# Patient Record
Sex: Male | Born: 1960 | Race: White | Hispanic: No | Marital: Married | Smoking: Never smoker
Health system: Southern US, Community
[De-identification: ages and names within clinical notes are randomized; demographics above are authoritative.]

---

## 2000-06-28 ENCOUNTER — Emergency Department (HOSPITAL_COMMUNITY): Admission: EM | Admit: 2000-06-28 | Discharge: 2000-06-28 | Payer: Self-pay | Admitting: Emergency Medicine

## 2000-08-07 ENCOUNTER — Encounter: Payer: Self-pay | Admitting: Emergency Medicine

## 2000-08-07 ENCOUNTER — Emergency Department (HOSPITAL_COMMUNITY): Admission: EM | Admit: 2000-08-07 | Discharge: 2000-08-07 | Payer: Self-pay | Admitting: Emergency Medicine

## 2000-08-10 ENCOUNTER — Ambulatory Visit (HOSPITAL_BASED_OUTPATIENT_CLINIC_OR_DEPARTMENT_OTHER): Admission: RE | Admit: 2000-08-10 | Discharge: 2000-08-10 | Payer: Self-pay | Admitting: Orthopaedic Surgery

## 2011-02-18 ENCOUNTER — Ambulatory Visit: Payer: 59 | Attending: Family Medicine | Admitting: Physical Therapy

## 2011-02-18 DIAGNOSIS — M545 Low back pain, unspecified: Secondary | ICD-10-CM | POA: Insufficient documentation

## 2011-02-18 DIAGNOSIS — IMO0001 Reserved for inherently not codable concepts without codable children: Secondary | ICD-10-CM | POA: Insufficient documentation

## 2011-02-18 DIAGNOSIS — M2569 Stiffness of other specified joint, not elsewhere classified: Secondary | ICD-10-CM | POA: Insufficient documentation

## 2011-02-27 ENCOUNTER — Encounter: Payer: 59 | Admitting: Physical Therapy

## 2012-02-18 ENCOUNTER — Emergency Department (HOSPITAL_COMMUNITY): Payer: 59

## 2012-02-18 ENCOUNTER — Encounter (HOSPITAL_COMMUNITY): Payer: Self-pay | Admitting: Emergency Medicine

## 2012-02-18 ENCOUNTER — Emergency Department (HOSPITAL_COMMUNITY)
Admission: EM | Admit: 2012-02-18 | Discharge: 2012-02-18 | Disposition: A | Discharge status: Home / Self Care | Payer: 59 | Attending: Emergency Medicine | Admitting: Emergency Medicine

## 2012-02-18 DIAGNOSIS — Y9329 Activity, other involving ice and snow: Secondary | ICD-10-CM | POA: Insufficient documentation

## 2012-02-18 DIAGNOSIS — S8990XA Unspecified injury of unspecified lower leg, initial encounter: Secondary | ICD-10-CM | POA: Insufficient documentation

## 2012-02-18 DIAGNOSIS — X500XXA Overexertion from strenuous movement or load, initial encounter: Secondary | ICD-10-CM | POA: Insufficient documentation

## 2012-02-18 DIAGNOSIS — M25562 Pain in left knee: Secondary | ICD-10-CM

## 2012-02-18 DIAGNOSIS — Y929 Unspecified place or not applicable: Secondary | ICD-10-CM | POA: Insufficient documentation

## 2012-02-18 DIAGNOSIS — Y9302 Activity, running: Secondary | ICD-10-CM | POA: Insufficient documentation

## 2012-02-18 DIAGNOSIS — S99929A Unspecified injury of unspecified foot, initial encounter: Secondary | ICD-10-CM | POA: Insufficient documentation

## 2012-02-18 MED ORDER — NAPROXEN 500 MG PO TABS: 500.0000 mg | ORAL_TABLET | Freq: Two times a day (BID) | ORAL | Status: AC

## 2012-02-18 NOTE — ED Provider Notes (Signed)
History     CSN: [id number]  Arrival date & time 02/18/12  1507   First MD Initiated Contact with Patient 02/18/12 1520      Chief Complaint  Patient presents with  . Leg Pain    (Consider location/radiation/quality/duration/timing/severity/associated sxs/prior treatment) HPI Comments: Patient reports that just prior to arrival he was jogging up a hill in the snow when he felt a pop in his left posterior knee.  He did not fall.  He has had mild swelling of the knee since that time.  He has been able to ambulate with a limp, but has increased pain with ambulation.  He is able to full flex the knee, but states that he is not able to fully extend the knee.  No previous injury of the knee.  He has not taken anything for pain prior to arrival.    Patient is a 52 y.o. male presenting with knee pain. The history is provided by the patient.  Knee Pain Pain details:    Onset quality:  Sudden   Timing:  Constant   Progression:  Unchanged Relieved by:  Nothing Worsened by:  Bearing weight and extension Ineffective treatments:  None tried Associated symptoms: decreased ROM   Associated symptoms: no fever and no numbness     History reviewed. No pertinent past medical history.  History reviewed. No pertinent past surgical history.  History reviewed. No pertinent family history.  History  Substance Use Topics  . Smoking status: Never Smoker   . Smokeless tobacco: Not on file  . Alcohol Use: Yes      Review of Systems  Constitutional: Negative for fever.  Musculoskeletal: Positive for joint swelling.       Left knee pain  Skin: Negative for color change.  Neurological: Negative for numbness.  All other systems reviewed and are negative.    Allergies  Review of patient's allergies indicates no known allergies.  Home Medications   Current Outpatient Rx  Name  Route  Sig  Dispense  Refill  . atorvastatin (LIPITOR) 80 MG tablet   Oral   Take 40 mg by mouth daily.          . fenofibrate micronized (LOFIBRA) 200 MG capsule   Oral   Take 200 mg by mouth daily before breakfast.         . sertraline (ZOLOFT) 50 MG tablet   Oral   Take 100 mg by mouth daily.           BP 122/99  Pulse 99  Temp(Src) 98.1 F (36.7 C) (Oral)  Resp 16  SpO2 97%  Physical Exam  Nursing note and vitals reviewed. Constitutional: He appears well-developed and well-nourished. No distress.  HENT:  Head: Normocephalic and atraumatic.  Neck: Normal range of motion. Neck supple.  Cardiovascular: Normal rate, regular rhythm and normal heart sounds.   Pulses:      Dorsalis pedis pulses are 2+ on the right side, and 2+ on the left side.  Pulmonary/Chest: Effort normal and breath sounds normal.  Musculoskeletal:       Left knee: He exhibits no erythema, no LCL laxity, normal patellar mobility and no MCL laxity. Tenderness found. Medial joint line tenderness noted.  Mild swelling of the posterior knee Patient with full flexion of the left knee.  Patient not able to fully extend knee.  Knee in approximately five degrees of flexion.   No pain with varus or valgus stress Negative Anterior and Posterior Drawer  Neurological: He is  alert. No sensory deficit.  Skin: Skin is warm and dry. He is not diaphoretic. No erythema.  Psychiatric: He has a normal mood and affect.    ED Course  Procedures (including critical care time)  Labs Reviewed - No data to display Dg Knee Complete 4 Views Left  02/18/2012  *RADIOLOGY REPORT*  Clinical Data: Sudden onset knee pain while jogging.  LEFT KNEE - COMPLETE 4+ VIEW  Comparison:  None.  Findings:  There is no evidence of fracture, dislocation, or joint effusion.  There is no evidence of arthropathy or other focal bone abnormality.  Soft tissues are unremarkable.  IMPRESSION: Negative.   Original Report Authenticated By: Myles Rosenthal, M.D.      No diagnosis found.    MDM  Patient presenting with left knee pain that presented while  running up a hill.  No obvious deformity.  Xray negative.  Neurovascularly intact.  Patient with pain with ambulation.  Patient given knee immobilizer and crutches.  Patient also given referral to Orthopedics.        Pascal Lux Covel, PA-C 02/19/12 [phone]

## 2012-02-18 NOTE — ED Notes (Signed)
Pt states that he was jogging up an incline in the snow and felt the back of his left knee "pop".  States that his knee "locked up".  No obvious deformity.  Pt walking with a severe limp.

## 2012-02-20 NOTE — ED Provider Notes (Signed)
Medical screening examination/treatment/procedure(s) were performed by non-physician practitioner and as supervising physician I was immediately available for consultation/collaboration.    Kenidee Cregan D Levis Nazir, MD 02/20/12 1041 

## 2012-03-03 ENCOUNTER — Ambulatory Visit
Admission: RE | Admit: 2012-03-03 | Discharge: 2012-03-03 | Disposition: A | Discharge status: Home / Self Care | Payer: 59 | Source: Ambulatory Visit | Attending: Orthopedic Surgery | Admitting: Orthopedic Surgery

## 2012-03-03 ENCOUNTER — Other Ambulatory Visit: Payer: Self-pay | Admitting: Orthopedic Surgery

## 2012-03-03 DIAGNOSIS — M79605 Pain in left leg: Secondary | ICD-10-CM

## 2015-02-20 IMAGING — US US EXTREM LOW VENOUS*L*
1 series · 14 of 24 positions shown · non-contrast
Comparison: None

CLINICAL DATA: LEFT LEG PAIN AND SWELLING/// DVT;;

LEFT LOWER EXTREMITY VENOUS DUPLEX ULTRASOUND
TECHNIQUE: Gray-scale sonography with compression, as well as color
and duplex ultrasound, were performed to evaluate the deep venous
system from the level of the common femoral vein through the
popliteal and proximal calf veins.

[Series 1: us extrem low venous*left* · 14 of 41 slices shown]
[im 1/41]
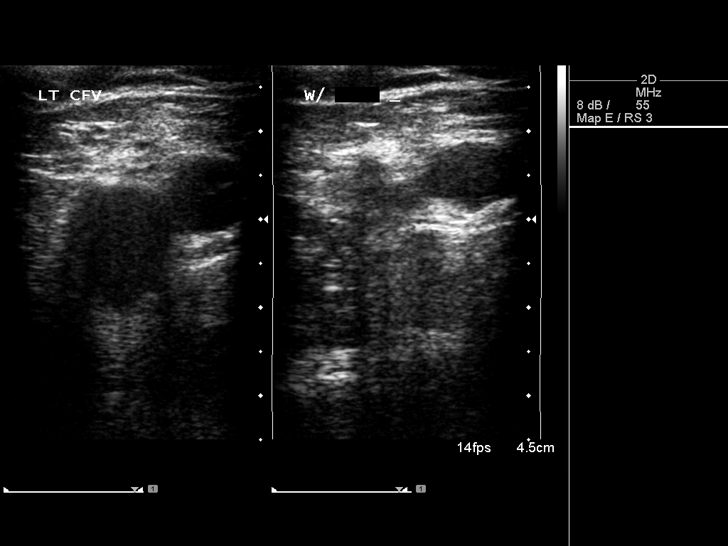
[im 4/41]
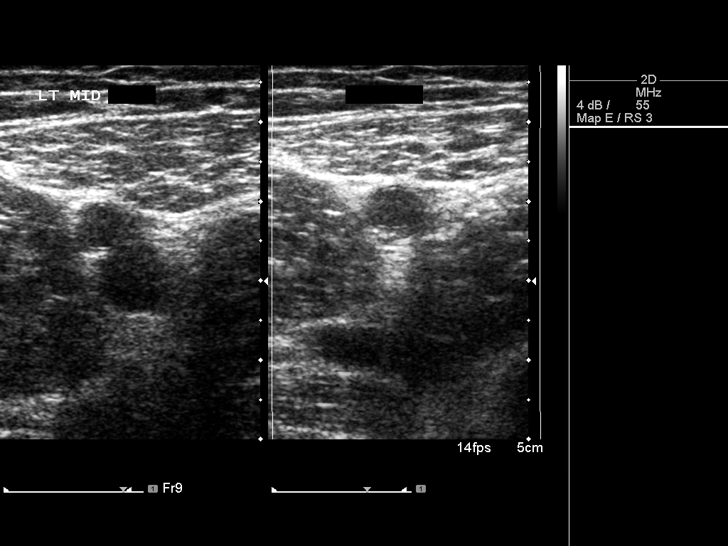
[im 7/41]
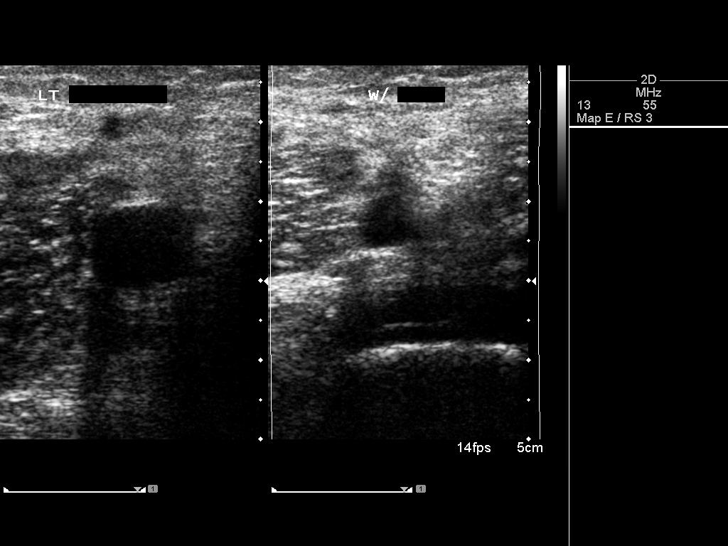
[im 11/41]
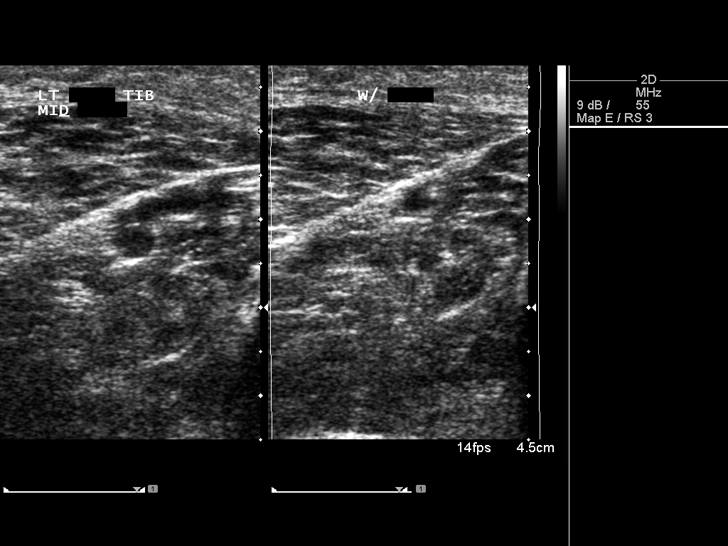
[im 13/41]
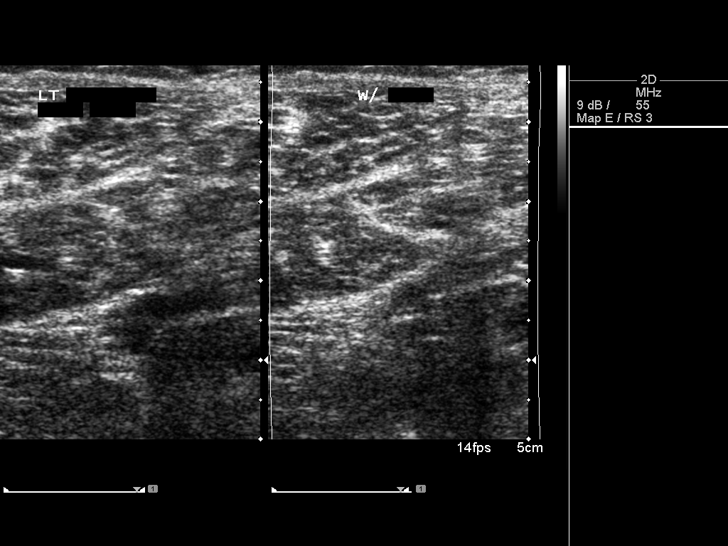
[im 16/41]
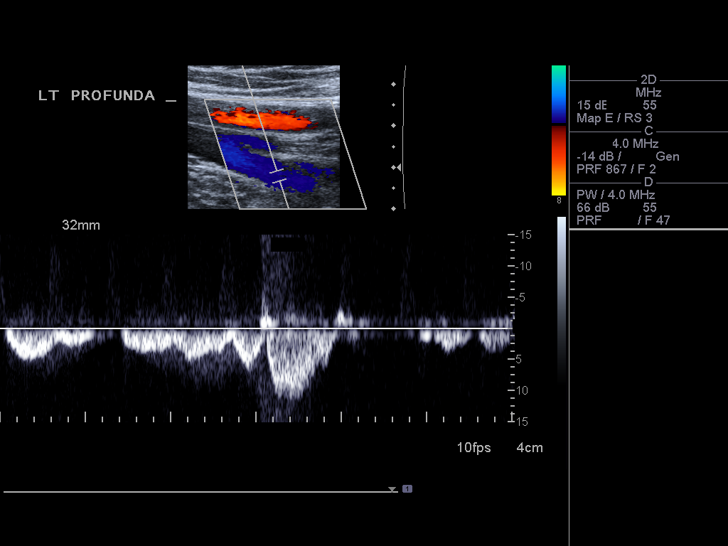
[im 20/41]
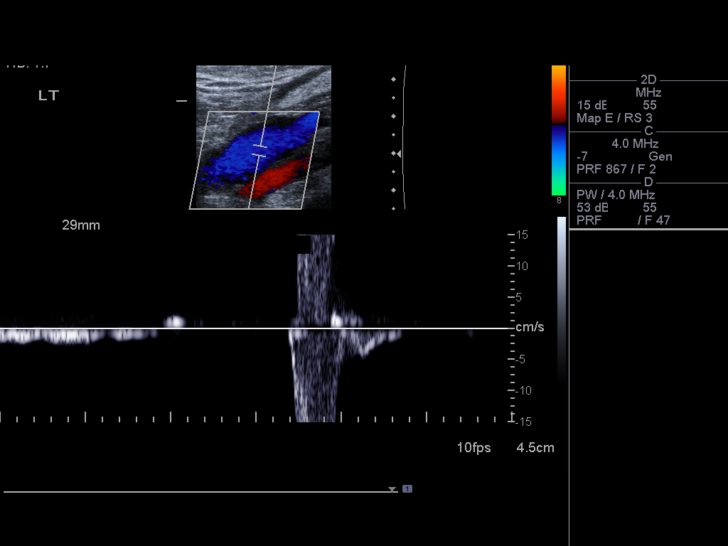
[im 21/41]
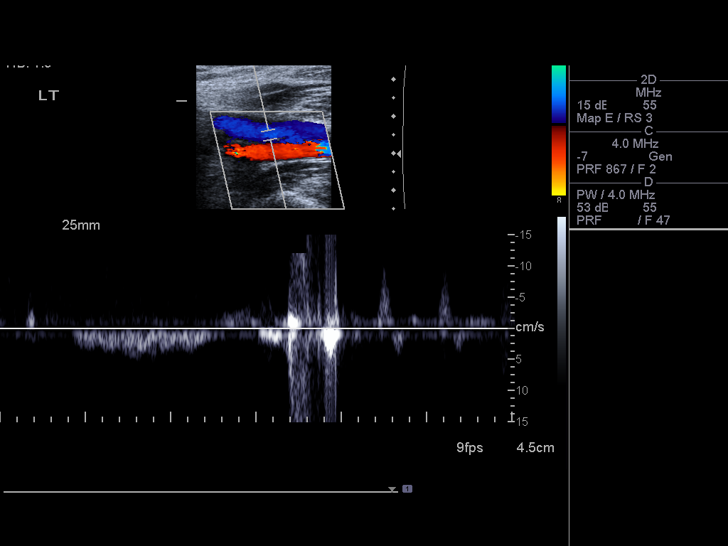
[im 25/41]
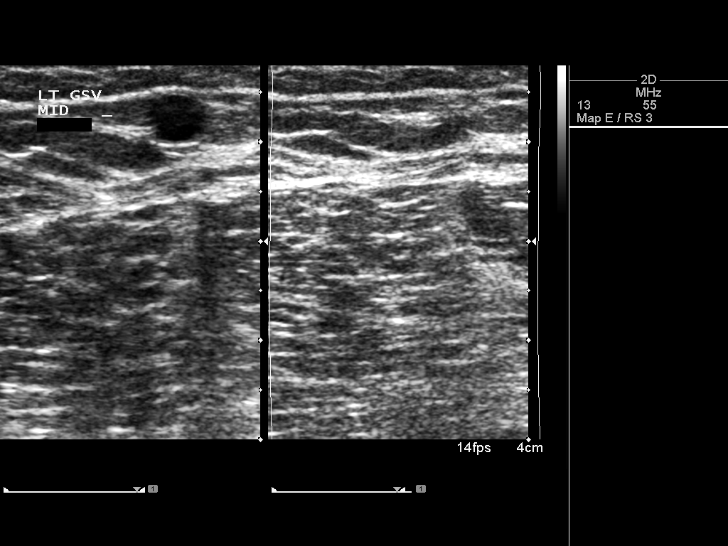
[im 28/41]
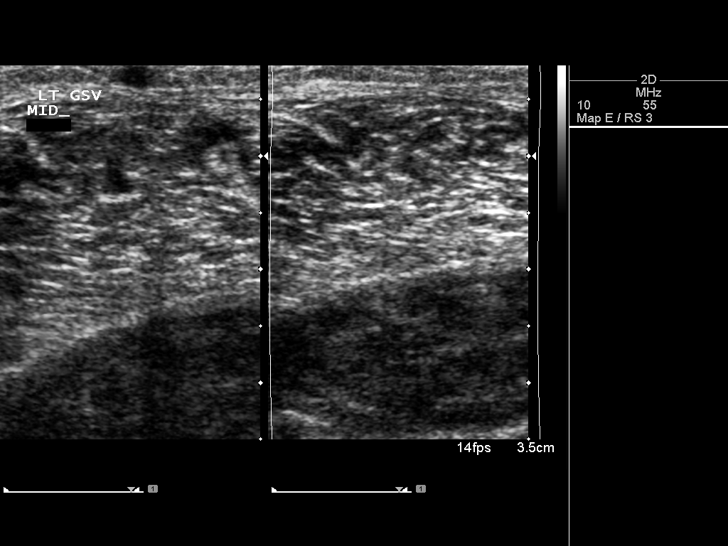
[im 32/41]
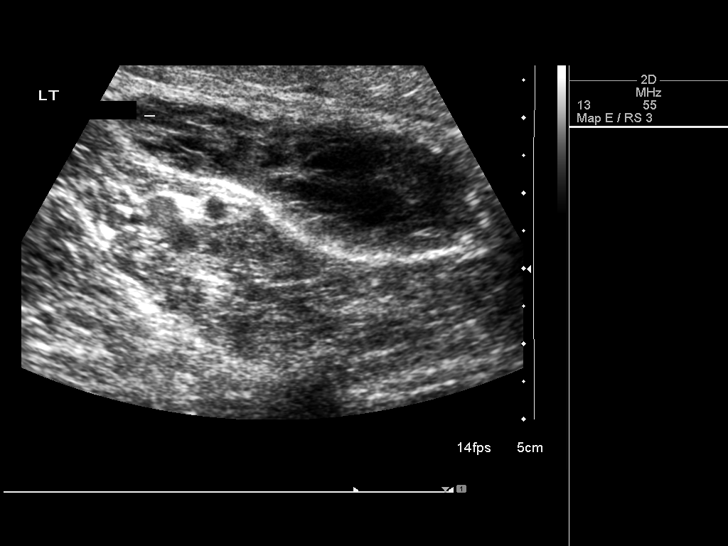
[im 34/41]
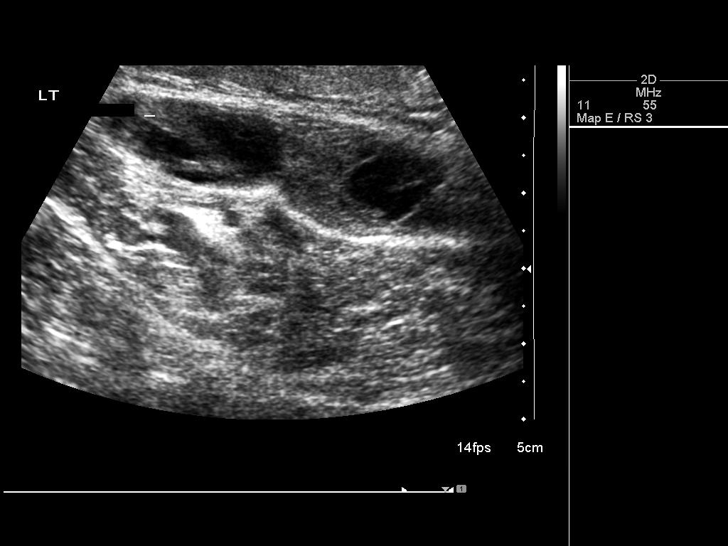
[im 37/41]
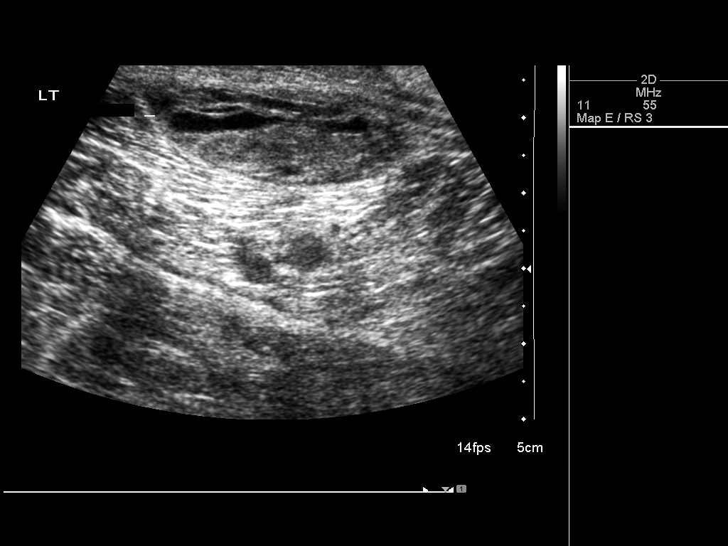
[im 41/41]
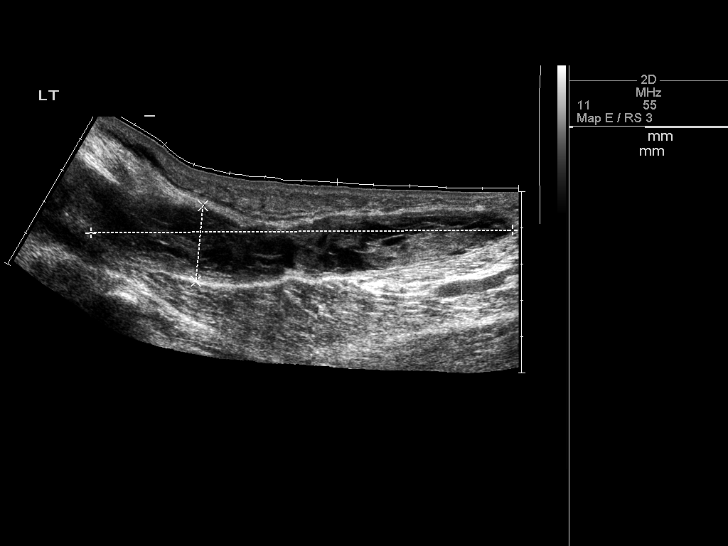

[14 of 24 positions shown; findings below may reference images not displayed]

FINDINGS: Normal compressibility and normal Doppler signal within
the common femoral, superficial femoral and popliteal veins, down
to the proximal calf veins.  No grayscale filling defects to
suggest DVT.

Complex fluid collection is noted within the proximal calf
measuring 11.6 x 2.3 x 4.8 cm.  This presumably represents
hematoma.
IMPRESSION: No evidence of left lower extremity deep vein thrombosis.

Complex fluid collection in the proximal calf, presumably hematoma.

## 2015-05-14 DIAGNOSIS — N401 Enlarged prostate with lower urinary tract symptoms: Secondary | ICD-10-CM | POA: Diagnosis not present

## 2015-07-05 ENCOUNTER — Encounter (HOSPITAL_COMMUNITY): Payer: Self-pay | Admitting: Emergency Medicine

## 2015-07-05 ENCOUNTER — Emergency Department (HOSPITAL_COMMUNITY)
Admission: EM | Admit: 2015-07-05 | Discharge: 2015-07-06 | Disposition: A | Discharge status: Home / Self Care | Payer: BLUE CROSS/BLUE SHIELD | Attending: Emergency Medicine | Admitting: Emergency Medicine

## 2015-07-05 DIAGNOSIS — Y929 Unspecified place or not applicable: Secondary | ICD-10-CM | POA: Insufficient documentation

## 2015-07-05 DIAGNOSIS — Y999 Unspecified external cause status: Secondary | ICD-10-CM | POA: Diagnosis not present

## 2015-07-05 DIAGNOSIS — S61210A Laceration without foreign body of right index finger without damage to nail, initial encounter: Secondary | ICD-10-CM | POA: Diagnosis not present

## 2015-07-05 DIAGNOSIS — W268XXA Contact with other sharp object(s), not elsewhere classified, initial encounter: Secondary | ICD-10-CM | POA: Diagnosis not present

## 2015-07-05 DIAGNOSIS — Y9389 Activity, other specified: Secondary | ICD-10-CM | POA: Insufficient documentation

## 2015-07-05 DIAGNOSIS — S61219A Laceration without foreign body of unspecified finger without damage to nail, initial encounter: Secondary | ICD-10-CM

## 2015-07-05 NOTE — ED Notes (Signed)
Dr. James at bedside  

## 2015-07-05 NOTE — ED Provider Notes (Signed)
CSN: [id number]     Arrival date & time 07/05/15  2250 History   By signing my name below, I, Vista Minkobert Ross, attest that this documentation has been prepared under the direction and in the presence of Rolland PorterMark Tijuana Scheidegger, MD. Electronically signed, Vista Minkobert Ross, ED Scribe. 07/06/2015. 12:35 AM.   Chief Complaint  Patient presents with  . Laceration   The history is provided by the patient. No language interpreter was used.   HPI Comments: Cherly BeachRonnie Arreguin is a 55 y.o. male who presents to the Emergency Department s/p an injury that occurred to his left pointer finger approximately 4 hours ago. Pt states he was using a hand saw when his hand slipped and he dropped it on his left pointer finger. Pt states he was not wearing any gloves. Bleeding is controlled at this time. Pt rates his pain currently 3/10. Pt is able to move the finger without difficulty.    History reviewed. No pertinent past medical history. History reviewed. No pertinent past surgical history. No family history on file. Social History  Substance Use Topics  . Smoking status: Never Smoker   . Smokeless tobacco: None  . Alcohol Use: Yes    Review of Systems  Constitutional: Negative for fever, chills, diaphoresis, appetite change and fatigue.  HENT: Negative for mouth sores, sore throat and trouble swallowing.   Eyes: Negative for visual disturbance.  Respiratory: Negative for cough, chest tightness, shortness of breath and wheezing.   Cardiovascular: Negative for chest pain.  Gastrointestinal: Negative for nausea, vomiting, abdominal pain, diarrhea and abdominal distention.  Endocrine: Negative for polydipsia, polyphagia and polyuria.  Genitourinary: Negative for dysuria, frequency and hematuria.  Musculoskeletal: Negative for gait problem.  Skin: Positive for wound (right pointer finger). Negative for color change, pallor and rash.  Neurological: Negative for dizziness, syncope, light-headedness and headaches.  Hematological: Does  not bruise/bleed easily.  Psychiatric/Behavioral: Negative for behavioral problems and confusion.  All other systems reviewed and are negative.     Allergies  Review of patient's allergies indicates no known allergies.  Home Medications   Prior to Admission medications   Medication Sig Start Date End Date Taking? Authorizing Provider  atorvastatin (LIPITOR) 80 MG tablet Take 40 mg by mouth daily.    Historical Provider, MD  fenofibrate micronized (LOFIBRA) 200 MG capsule Take 200 mg by mouth daily before breakfast.    Historical Provider, MD  naproxen (NAPROSYN) 500 MG tablet Take 1 tablet (500 mg total) by mouth 2 (two) times daily. 02/18/12   Heather Laisure, PA-C  sertraline (ZOLOFT) 50 MG tablet Take 100 mg by mouth daily.    Historical Provider, MD   BP 134/89 mmHg  Pulse 70  Temp(Src) 98.4 F (36.9 C) (Oral)  Resp 16  Ht 5\' 11"  (1.803 m)  Wt 188 lb (85.276 kg)  BMI 26.23 kg/m2  SpO2 96% Physical Exam  Constitutional: He is oriented to person, place, and time. He appears well-developed and well-nourished. No distress.  HENT:  Head: Normocephalic.  Eyes: Conjunctivae are normal. Pupils are equal, round, and reactive to light. No scleral icterus.  Neck: Normal range of motion. Neck supple. No thyromegaly present.  Cardiovascular: Normal rate and regular rhythm.  Exam reveals no gallop and no friction rub.   No murmur heard. Pulmonary/Chest: Effort normal and breath sounds normal. No respiratory distress. He has no wheezes. He has no rales.  Abdominal: Soft. Bowel sounds are normal. He exhibits no distension. There is no tenderness. There is no rebound.  Musculoskeletal:  Normal range of motion.  2cm laceration on dorsum of right second mcp Tendon and sensation are both intact.  Neurological: He is alert and oriented to person, place, and time.  Skin: Skin is warm and dry. No rash noted.  Psychiatric: He has a normal mood and affect. His behavior is normal.  Nursing note  and vitals reviewed.   ED Course  Procedures  DIAGNOSTIC STUDIES: Oxygen Saturation is 96% on RA, normal by my interpretation.  COORDINATION OF CARE: 11:59 PM-Will order suture and medication for pain. Discussed treatment plan with pt at bedside and pt agreed to plan.   Labs Review Labs Reviewed - No data to display  Imaging Review No results found. I have personally reviewed and evaluated these images and lab results as part of my medical decision-making.   EKG Interpretation None      MDM   Final diagnoses:  Finger laceration, initial encounter    LACERATION REPAIR Performed by: Claudean KindsJAMES, Tyerra Loretto JOSEPH Authorized by: Claudean KindsJAMES, Tryniti Laatsch JOSEPH Consent: Verbal consent obtained. Risks and benefits: risks, benefits and alternatives were discussed Consent given by: patient Patient identity confirmed: provided demographic data Prepped and Draped in normal sterile fashion Wound explored  Laceration Location: RIF  Laceration Length: 2cm  No Foreign Bodies seen or palpated  Anesthesia: local infiltration  Local anesthetic: lidocaine 1% c epinephrine  Anesthetic total: 3 ml  Irrigation method: syringe Amount of cleaning: standard  Skin closure: 4-0 Nylon  Number of sutures: 5  Technique: simple + 1 Horizontal mattress  Patient tolerance: Patient tolerated the procedure well with no immediate complications.    Rolland PorterMark Michie Molnar, MD 07/06/15 [phone]

## 2015-07-05 NOTE — ED Notes (Signed)
Pt from home with a 1 inch deep laceration on his right pointer finger after a saw fell on his hand. Bleeding is controlled at this time. Pt is able to move his finger and rates his pain at 3/10. Pt's last tetanus shot was within the past year.

## 2015-07-06 NOTE — ED Notes (Signed)
Pt given discharge instructions, verbalized understanding of need to follow up for suture removal, and reasons to return to the ED. Pt reports pain controlled. Pt aware of suture care at home. Pt denied further questions or concerns at this time. Pt able to ambulate to exit without difficulty.

## 2015-07-06 NOTE — ED Notes (Signed)
MD has suture supplies at bedside.

## 2015-07-06 NOTE — Discharge Instructions (Signed)
Suture removal in 10 days  Laceration Care, Adult A laceration is a cut that goes through all layers of the skin. The cut also goes into the tissue that is right under the skin. Some cuts heal on their own. Others need to be closed with stitches (sutures), staples, skin adhesive strips, or wound glue. Taking care of your cut lowers your risk of infection and helps your cut to heal better. HOW TO TAKE CARE OF YOUR CUT For stitches or staples:  Keep the wound clean and dry.  If you were given a bandage (dressing), you should change it at least one time per day or as told by your doctor. You should also change it if it gets wet or dirty.  Keep the wound completely dry for the first 24 hours or as told by your doctor. After that time, you may take a shower or a bath. However, make sure that the wound is not soaked in water until after the stitches or staples have been removed.  Clean the wound one time each day or as told by your doctor:  Wash the wound with soap and water.  Rinse the wound with water until all of the soap comes off.  Pat the wound dry with a clean towel. Do not rub the wound.  After you clean the wound, put a thin layer of antibiotic ointment on it as told by your doctor. This ointment:  Helps to prevent infection.  Keeps the bandage from sticking to the wound.  Have your stitches or staples removed as told by your doctor. If your doctor used skin adhesive strips:   Keep the wound clean and dry.  If you were given a bandage, you should change it at least one time per day or as told by your doctor. You should also change it if it gets dirty or wet.  Do not get the skin adhesive strips wet. You can take a shower or a bath, but be careful to keep the wound dry.  If the wound gets wet, pat it dry with a clean towel. Do not rub the wound.  Skin adhesive strips fall off on their own. You can trim the strips as the wound heals. Do not remove any strips that are still  stuck to the wound. They will fall off after a while. If your doctor used wound glue:  Try to keep your wound dry, but you may briefly wet it in the shower or bath. Do not soak the wound in water, such as by swimming.  After you take a shower or a bath, gently pat the wound dry with a clean towel. Do not rub the wound.  Do not do any activities that will make you really sweaty until the skin glue has fallen off on its own.  Do not apply liquid, cream, or ointment medicine to your wound while the skin glue is still on.  If you were given a bandage, you should change it at least one time per day or as told by your doctor. You should also change it if it gets dirty or wet.  If a bandage is placed over the wound, do not let the tape for the bandage touch the skin glue.  Do not pick at the glue. The skin glue usually stays on for 5-10 days. Then, it falls off of the skin. General Instructions  To help prevent scarring, make sure to cover your wound with sunscreen whenever you are outside after stitches are  removed, after adhesive strips are removed, or when wound glue stays in place and the wound is healed. Make sure to wear a sunscreen of at least 30 SPF.  Take over-the-counter and prescription medicines only as told by your doctor.  If you were given antibiotic medicine or ointment, take or apply it as told by your doctor. Do not stop using the antibiotic even if your wound is getting better.  Do not scratch or pick at the wound.  Keep all follow-up visits as told by your doctor. This is important.  Check your wound every day for signs of infection. Watch for:  Redness, swelling, or pain.  Fluid, blood, or pus.  Raise (elevate) the injured area above the level of your heart while you are sitting or lying down, if possible. GET HELP IF:  You got a tetanus shot and you have any of these problems at the injection site:  Swelling.  Very bad pain.  Redness.  Bleeding.  You  have a fever.  A wound that was closed breaks open.  You notice a bad smell coming from your wound or your bandage.  You notice something coming out of the wound, such as wood or glass.  Medicine does not help your pain.  You have more redness, swelling, or pain at the site of your wound.  You have fluid, blood, or pus coming from your wound.  You notice a change in the color of your skin near your wound.  You need to change the bandage often because fluid, blood, or pus is coming from the wound.  You start to have a new rash.  You start to have numbness around the wound. GET HELP RIGHT AWAY IF:  You have very bad swelling around the wound.  Your pain suddenly gets worse and is very bad.  You notice painful lumps near the wound or on skin that is anywhere on your body.  You have a red streak going away from your wound.  The wound is on your hand or foot and you cannot move a finger or toe like you usually can.  The wound is on your hand or foot and you notice that your fingers or toes look pale or bluish.   This information is not intended to replace advice given to you by your health care provider. Make sure you discuss any questions you have with your health care provider.   Document Released: 06/10/2007 Document Revised: 05/08/2014 Document Reviewed: 12/18/2013 Elsevier Interactive Patient Education [employer]

## 2015-09-27 DIAGNOSIS — R946 Abnormal results of thyroid function studies: Secondary | ICD-10-CM | POA: Diagnosis not present

## 2015-09-27 DIAGNOSIS — N400 Enlarged prostate without lower urinary tract symptoms: Secondary | ICD-10-CM | POA: Diagnosis not present

## 2015-09-27 DIAGNOSIS — Z125 Encounter for screening for malignant neoplasm of prostate: Secondary | ICD-10-CM | POA: Diagnosis not present

## 2015-09-27 DIAGNOSIS — Z79899 Other long term (current) drug therapy: Secondary | ICD-10-CM | POA: Diagnosis not present

## 2015-09-27 DIAGNOSIS — E782 Mixed hyperlipidemia: Secondary | ICD-10-CM | POA: Diagnosis not present

## 2015-09-27 DIAGNOSIS — Z1159 Encounter for screening for other viral diseases: Secondary | ICD-10-CM | POA: Diagnosis not present

## 2015-09-27 DIAGNOSIS — F419 Anxiety disorder, unspecified: Secondary | ICD-10-CM | POA: Diagnosis not present

## 2015-09-27 DIAGNOSIS — E559 Vitamin D deficiency, unspecified: Secondary | ICD-10-CM | POA: Diagnosis not present

## 2015-12-17 DIAGNOSIS — Z111 Encounter for screening for respiratory tuberculosis: Secondary | ICD-10-CM | POA: Diagnosis not present

## 2016-07-19 DIAGNOSIS — L089 Local infection of the skin and subcutaneous tissue, unspecified: Secondary | ICD-10-CM | POA: Diagnosis not present

## 2016-07-19 DIAGNOSIS — R21 Rash and other nonspecific skin eruption: Secondary | ICD-10-CM | POA: Diagnosis not present

## 2016-07-19 DIAGNOSIS — S40862A Insect bite (nonvenomous) of left upper arm, initial encounter: Secondary | ICD-10-CM | POA: Diagnosis not present

## 2016-07-21 DIAGNOSIS — R21 Rash and other nonspecific skin eruption: Secondary | ICD-10-CM | POA: Diagnosis not present

## 2016-07-21 DIAGNOSIS — S40862A Insect bite (nonvenomous) of left upper arm, initial encounter: Secondary | ICD-10-CM | POA: Diagnosis not present

## 2016-09-28 DIAGNOSIS — N401 Enlarged prostate with lower urinary tract symptoms: Secondary | ICD-10-CM | POA: Diagnosis not present

## 2016-09-28 DIAGNOSIS — E039 Hypothyroidism, unspecified: Secondary | ICD-10-CM | POA: Diagnosis not present

## 2016-09-28 DIAGNOSIS — Z79899 Other long term (current) drug therapy: Secondary | ICD-10-CM | POA: Diagnosis not present

## 2016-09-28 DIAGNOSIS — F419 Anxiety disorder, unspecified: Secondary | ICD-10-CM | POA: Diagnosis not present

## 2016-09-28 DIAGNOSIS — R739 Hyperglycemia, unspecified: Secondary | ICD-10-CM | POA: Diagnosis not present

## 2016-09-28 DIAGNOSIS — E559 Vitamin D deficiency, unspecified: Secondary | ICD-10-CM | POA: Diagnosis not present

## 2016-09-28 DIAGNOSIS — E782 Mixed hyperlipidemia: Secondary | ICD-10-CM | POA: Diagnosis not present

## 2016-11-02 DIAGNOSIS — E559 Vitamin D deficiency, unspecified: Secondary | ICD-10-CM | POA: Diagnosis not present

## 2016-11-02 DIAGNOSIS — Z111 Encounter for screening for respiratory tuberculosis: Secondary | ICD-10-CM | POA: Diagnosis not present

## 2016-11-02 DIAGNOSIS — N401 Enlarged prostate with lower urinary tract symptoms: Secondary | ICD-10-CM | POA: Diagnosis not present

## 2016-11-09 DIAGNOSIS — R300 Dysuria: Secondary | ICD-10-CM | POA: Diagnosis not present

## 2016-11-09 DIAGNOSIS — M545 Low back pain: Secondary | ICD-10-CM | POA: Diagnosis not present

## 2017-05-14 DIAGNOSIS — R739 Hyperglycemia, unspecified: Secondary | ICD-10-CM | POA: Diagnosis not present

## 2017-05-14 DIAGNOSIS — Z0000 Encounter for general adult medical examination without abnormal findings: Secondary | ICD-10-CM | POA: Diagnosis not present

## 2017-05-14 DIAGNOSIS — E782 Mixed hyperlipidemia: Secondary | ICD-10-CM | POA: Diagnosis not present

## 2017-05-14 DIAGNOSIS — N401 Enlarged prostate with lower urinary tract symptoms: Secondary | ICD-10-CM | POA: Diagnosis not present

## 2017-05-14 DIAGNOSIS — E039 Hypothyroidism, unspecified: Secondary | ICD-10-CM | POA: Diagnosis not present

## 2017-05-14 DIAGNOSIS — F419 Anxiety disorder, unspecified: Secondary | ICD-10-CM | POA: Diagnosis not present

## 2017-05-14 DIAGNOSIS — E559 Vitamin D deficiency, unspecified: Secondary | ICD-10-CM | POA: Diagnosis not present

## 2017-09-22 DIAGNOSIS — Z111 Encounter for screening for respiratory tuberculosis: Secondary | ICD-10-CM | POA: Diagnosis not present

## 2017-10-14 DIAGNOSIS — N410 Acute prostatitis: Secondary | ICD-10-CM | POA: Diagnosis not present

## 2017-10-14 DIAGNOSIS — M79644 Pain in right finger(s): Secondary | ICD-10-CM | POA: Diagnosis not present

## 2017-11-26 DIAGNOSIS — E559 Vitamin D deficiency, unspecified: Secondary | ICD-10-CM | POA: Diagnosis not present

## 2017-11-26 DIAGNOSIS — Z79899 Other long term (current) drug therapy: Secondary | ICD-10-CM | POA: Diagnosis not present

## 2017-11-26 DIAGNOSIS — F419 Anxiety disorder, unspecified: Secondary | ICD-10-CM | POA: Diagnosis not present

## 2017-11-26 DIAGNOSIS — E782 Mixed hyperlipidemia: Secondary | ICD-10-CM | POA: Diagnosis not present

## 2017-11-26 DIAGNOSIS — R739 Hyperglycemia, unspecified: Secondary | ICD-10-CM | POA: Diagnosis not present

## 2017-11-26 DIAGNOSIS — Z125 Encounter for screening for malignant neoplasm of prostate: Secondary | ICD-10-CM | POA: Diagnosis not present

## 2017-11-26 DIAGNOSIS — N401 Enlarged prostate with lower urinary tract symptoms: Secondary | ICD-10-CM | POA: Diagnosis not present

## 2018-05-23 DIAGNOSIS — F419 Anxiety disorder, unspecified: Secondary | ICD-10-CM | POA: Diagnosis not present

## 2018-05-23 DIAGNOSIS — E782 Mixed hyperlipidemia: Secondary | ICD-10-CM | POA: Diagnosis not present

## 2018-05-23 DIAGNOSIS — N401 Enlarged prostate with lower urinary tract symptoms: Secondary | ICD-10-CM | POA: Diagnosis not present

## 2018-08-22 DIAGNOSIS — Z111 Encounter for screening for respiratory tuberculosis: Secondary | ICD-10-CM | POA: Diagnosis not present

## 2018-09-28 DIAGNOSIS — N401 Enlarged prostate with lower urinary tract symptoms: Secondary | ICD-10-CM | POA: Diagnosis not present

## 2018-09-28 DIAGNOSIS — R350 Frequency of micturition: Secondary | ICD-10-CM | POA: Diagnosis not present

## 2018-11-09 DIAGNOSIS — E039 Hypothyroidism, unspecified: Secondary | ICD-10-CM | POA: Diagnosis not present

## 2018-11-09 DIAGNOSIS — R946 Abnormal results of thyroid function studies: Secondary | ICD-10-CM | POA: Diagnosis not present

## 2018-11-09 DIAGNOSIS — N401 Enlarged prostate with lower urinary tract symptoms: Secondary | ICD-10-CM | POA: Diagnosis not present

## 2018-11-09 DIAGNOSIS — E559 Vitamin D deficiency, unspecified: Secondary | ICD-10-CM | POA: Diagnosis not present

## 2018-11-09 DIAGNOSIS — R739 Hyperglycemia, unspecified: Secondary | ICD-10-CM | POA: Diagnosis not present

## 2018-11-09 DIAGNOSIS — E782 Mixed hyperlipidemia: Secondary | ICD-10-CM | POA: Diagnosis not present

## 2018-11-09 DIAGNOSIS — Z79899 Other long term (current) drug therapy: Secondary | ICD-10-CM | POA: Diagnosis not present

## 2020-05-08 DIAGNOSIS — E039 Hypothyroidism, unspecified: Secondary | ICD-10-CM | POA: Diagnosis not present

## 2020-08-09 DIAGNOSIS — Z111 Encounter for screening for respiratory tuberculosis: Secondary | ICD-10-CM | POA: Diagnosis not present

## 2020-08-26 ENCOUNTER — Ambulatory Visit
Admission: RE | Admit: 2020-08-26 | Discharge: 2020-08-26 | Disposition: A | Discharge status: Home / Self Care | Payer: BLUE CROSS/BLUE SHIELD | Source: Ambulatory Visit | Attending: Obstetrics and Gynecology | Admitting: Obstetrics and Gynecology

## 2020-08-26 ENCOUNTER — Other Ambulatory Visit: Payer: Self-pay

## 2020-08-26 ENCOUNTER — Other Ambulatory Visit: Payer: Self-pay | Admitting: Obstetrics and Gynecology

## 2020-08-26 DIAGNOSIS — R7611 Nonspecific reaction to tuberculin skin test without active tuberculosis: Secondary | ICD-10-CM | POA: Diagnosis not present

## 2020-08-26 DIAGNOSIS — Z111 Encounter for screening for respiratory tuberculosis: Secondary | ICD-10-CM | POA: Diagnosis not present

## 2020-08-26 DIAGNOSIS — R7612 Nonspecific reaction to cell mediated immunity measurement of gamma interferon antigen response without active tuberculosis: Secondary | ICD-10-CM | POA: Diagnosis not present

## 2020-11-08 DIAGNOSIS — R739 Hyperglycemia, unspecified: Secondary | ICD-10-CM | POA: Diagnosis not present

## 2020-11-08 DIAGNOSIS — R5383 Other fatigue: Secondary | ICD-10-CM | POA: Diagnosis not present

## 2020-11-08 DIAGNOSIS — N401 Enlarged prostate with lower urinary tract symptoms: Secondary | ICD-10-CM | POA: Diagnosis not present

## 2020-11-08 DIAGNOSIS — E663 Overweight: Secondary | ICD-10-CM | POA: Diagnosis not present

## 2020-11-08 DIAGNOSIS — F419 Anxiety disorder, unspecified: Secondary | ICD-10-CM | POA: Diagnosis not present

## 2020-11-08 DIAGNOSIS — E782 Mixed hyperlipidemia: Secondary | ICD-10-CM | POA: Diagnosis not present

## 2020-11-08 DIAGNOSIS — Z125 Encounter for screening for malignant neoplasm of prostate: Secondary | ICD-10-CM | POA: Diagnosis not present

## 2020-11-08 DIAGNOSIS — E559 Vitamin D deficiency, unspecified: Secondary | ICD-10-CM | POA: Diagnosis not present

## 2021-08-04 DIAGNOSIS — Z111 Encounter for screening for respiratory tuberculosis: Secondary | ICD-10-CM | POA: Diagnosis not present

## 2021-11-12 DIAGNOSIS — F419 Anxiety disorder, unspecified: Secondary | ICD-10-CM | POA: Diagnosis not present

## 2021-11-12 DIAGNOSIS — N401 Enlarged prostate with lower urinary tract symptoms: Secondary | ICD-10-CM | POA: Diagnosis not present

## 2021-11-12 DIAGNOSIS — E559 Vitamin D deficiency, unspecified: Secondary | ICD-10-CM | POA: Diagnosis not present

## 2021-11-12 DIAGNOSIS — Z23 Encounter for immunization: Secondary | ICD-10-CM | POA: Diagnosis not present

## 2021-11-12 DIAGNOSIS — E782 Mixed hyperlipidemia: Secondary | ICD-10-CM | POA: Diagnosis not present

## 2021-11-12 DIAGNOSIS — E039 Hypothyroidism, unspecified: Secondary | ICD-10-CM | POA: Diagnosis not present

## 2021-11-12 DIAGNOSIS — M25511 Pain in right shoulder: Secondary | ICD-10-CM | POA: Diagnosis not present

## 2021-11-17 DIAGNOSIS — R7303 Prediabetes: Secondary | ICD-10-CM | POA: Diagnosis not present

## 2022-01-07 DIAGNOSIS — J069 Acute upper respiratory infection, unspecified: Secondary | ICD-10-CM | POA: Diagnosis not present

## 2022-01-07 DIAGNOSIS — Z03818 Encounter for observation for suspected exposure to other biological agents ruled out: Secondary | ICD-10-CM | POA: Diagnosis not present

## 2022-01-07 DIAGNOSIS — R051 Acute cough: Secondary | ICD-10-CM | POA: Diagnosis not present

## 2022-01-07 DIAGNOSIS — H109 Unspecified conjunctivitis: Secondary | ICD-10-CM | POA: Diagnosis not present

## 2022-01-07 DIAGNOSIS — B9689 Other specified bacterial agents as the cause of diseases classified elsewhere: Secondary | ICD-10-CM | POA: Diagnosis not present

## 2022-01-21 DIAGNOSIS — J209 Acute bronchitis, unspecified: Secondary | ICD-10-CM | POA: Diagnosis not present

## 2022-03-19 DIAGNOSIS — Z23 Encounter for immunization: Secondary | ICD-10-CM | POA: Diagnosis not present

## 2022-07-15 DIAGNOSIS — Z111 Encounter for screening for respiratory tuberculosis: Secondary | ICD-10-CM | POA: Diagnosis not present

## 2023-05-04 ENCOUNTER — Other Ambulatory Visit (HOSPITAL_COMMUNITY): Payer: Self-pay | Admitting: Family Medicine

## 2023-05-04 DIAGNOSIS — E782 Mixed hyperlipidemia: Secondary | ICD-10-CM

## 2023-05-06 ENCOUNTER — Ambulatory Visit (HOSPITAL_COMMUNITY)
Admission: RE | Admit: 2023-05-06 | Discharge: 2023-05-06 | Disposition: A | Discharge status: Home / Self Care | Payer: Self-pay | Source: Ambulatory Visit | Attending: Family Medicine | Admitting: Family Medicine

## 2023-05-06 DIAGNOSIS — E782 Mixed hyperlipidemia: Secondary | ICD-10-CM | POA: Insufficient documentation

## 2023-08-15 IMAGING — CR DG CHEST 2V
2 series · 2 of 2 positions shown · non-contrast
Comparison: None.

CLINICAL DATA: 60-year-old male with a history of positive TB test

EXAM:
CHEST - 2 VIEW

[w chest pa]
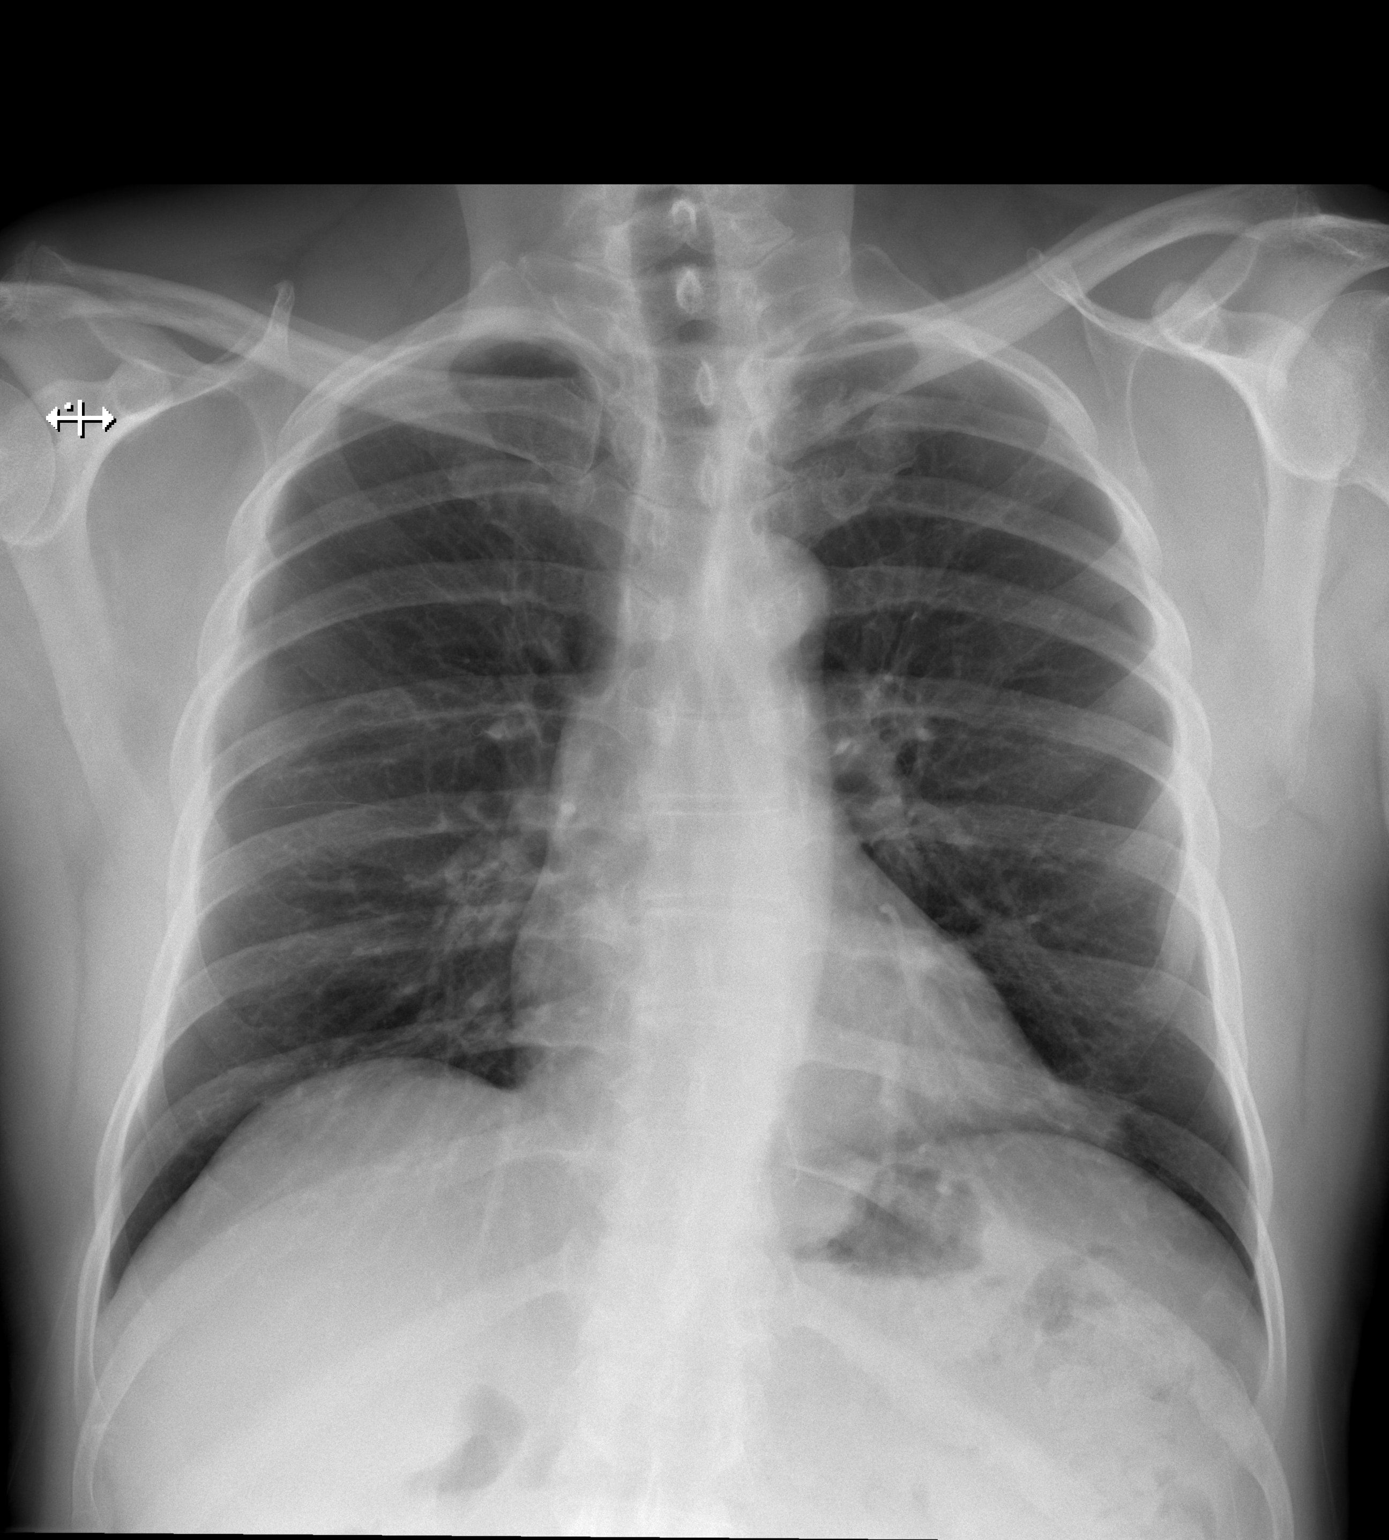

[w chest lat]
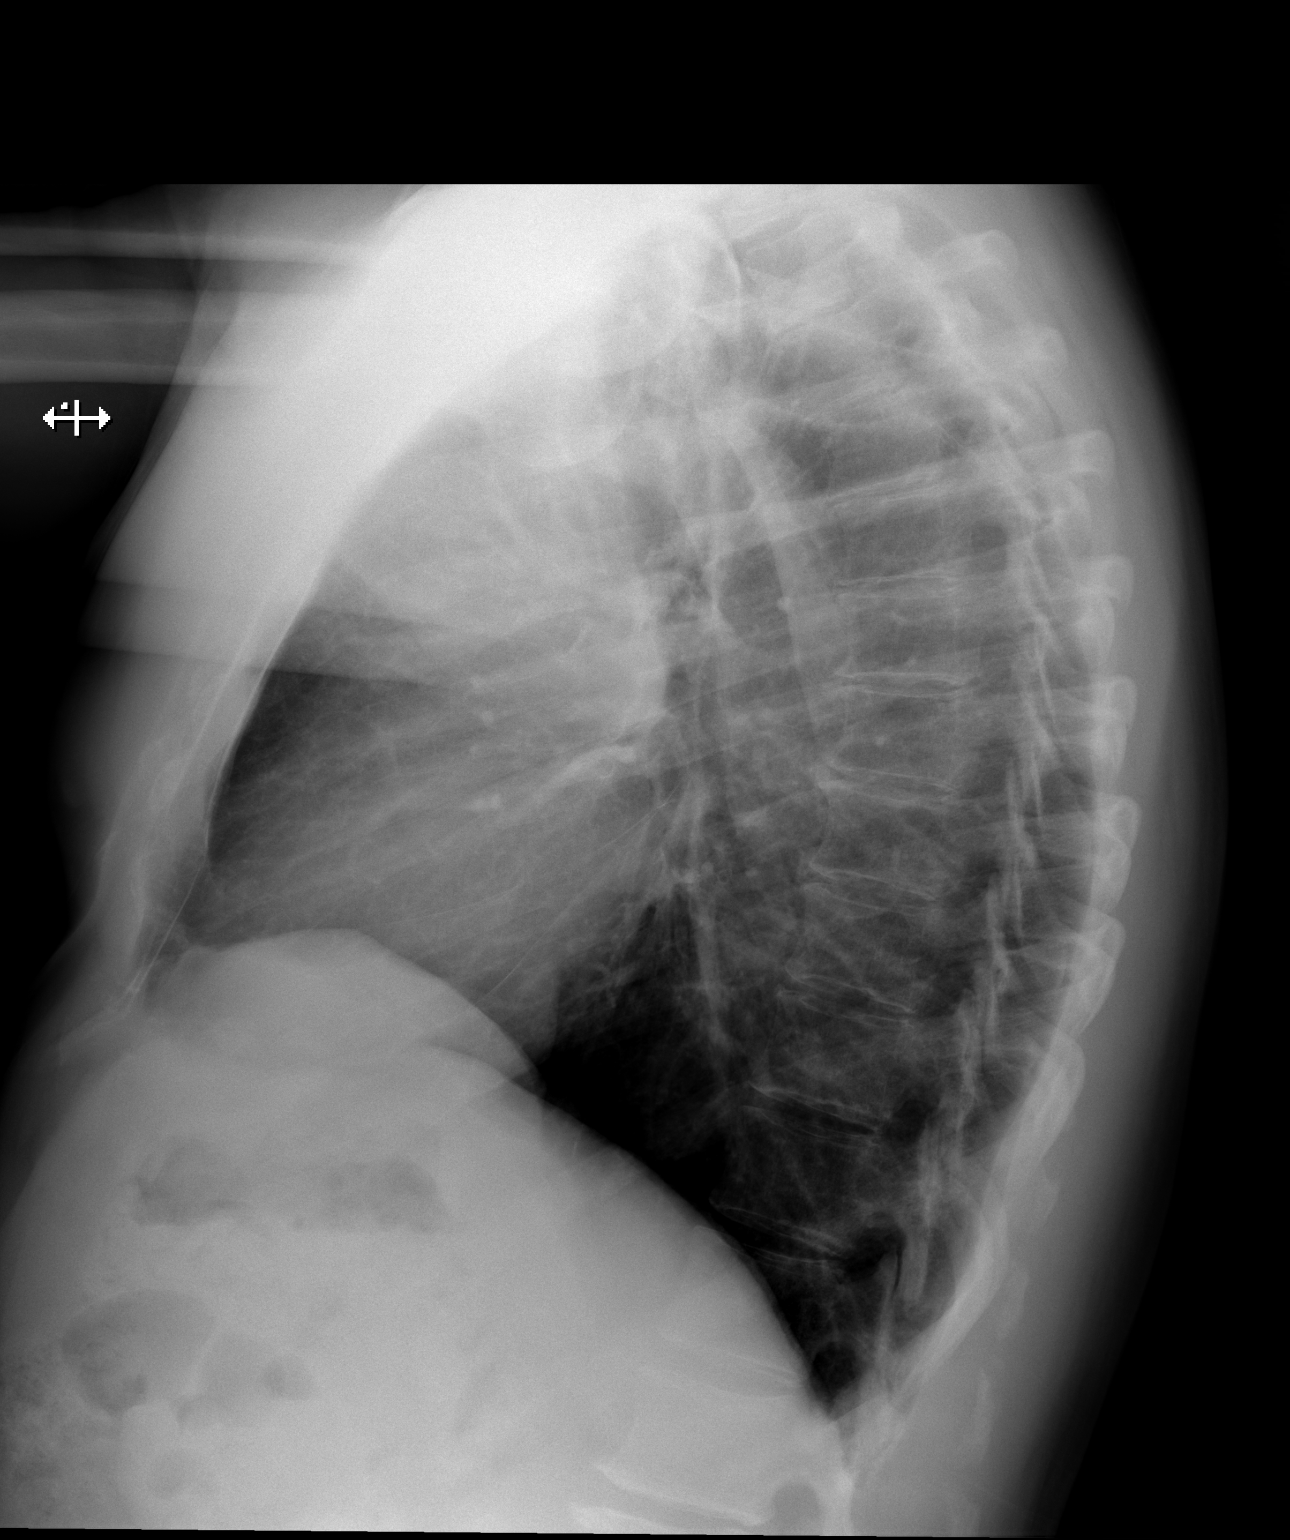

[2 of 2 positions shown; findings below may reference images not displayed]

FINDINGS: Cardiomediastinal silhouette within normal limits in size and
contour. No evidence of central vascular congestion. No interlobular
septal thickening.

No calcified lung nodules or mediastinal nodes.

No pneumothorax or pleural effusion. Coarsened interstitial
markings, with no confluent airspace disease.

No acute displaced fracture. Degenerative changes of the spine.
IMPRESSION: No active cardiopulmonary disease.
